# Patient Record
Sex: Female | Born: 1995 | Hispanic: Yes | Marital: Single | Smoking: Never smoker
Health system: Southern US, Community
[De-identification: ages and names within clinical notes are randomized; demographics above are authoritative.]

## PROBLEM LIST (undated history)

## (undated) ENCOUNTER — Ambulatory Visit: Payer: Medicaid Other

## (undated) DIAGNOSIS — J45909 Unspecified asthma, uncomplicated: Secondary | ICD-10-CM

## (undated) DIAGNOSIS — F419 Anxiety disorder, unspecified: Secondary | ICD-10-CM

## (undated) DIAGNOSIS — F319 Bipolar disorder, unspecified: Secondary | ICD-10-CM

## (undated) HISTORY — DX: Bipolar disorder, unspecified: F31.9

## (undated) HISTORY — DX: Unspecified asthma, uncomplicated: J45.909

## (undated) HISTORY — PX: ABDOMINAL SURGERY: SHX537

## (undated) HISTORY — DX: Anxiety disorder, unspecified: F41.9

---
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
## 2015-01-20 ENCOUNTER — Encounter: Payer: Self-pay | Admitting: Emergency Medicine

## 2015-01-20 ENCOUNTER — Emergency Department
Admission: EM | Admit: 2015-01-20 | Discharge: 2015-01-20 | Disposition: A | Discharge status: Home / Self Care | Payer: Medicaid Other | Attending: Emergency Medicine | Admitting: Emergency Medicine

## 2015-01-20 DIAGNOSIS — R51 Headache: Secondary | ICD-10-CM | POA: Diagnosis present

## 2015-01-20 DIAGNOSIS — M549 Dorsalgia, unspecified: Secondary | ICD-10-CM | POA: Insufficient documentation

## 2015-01-20 DIAGNOSIS — G8929 Other chronic pain: Secondary | ICD-10-CM | POA: Insufficient documentation

## 2015-01-20 DIAGNOSIS — G43909 Migraine, unspecified, not intractable, without status migrainosus: Secondary | ICD-10-CM | POA: Diagnosis not present

## 2015-01-20 DIAGNOSIS — G43009 Migraine without aura, not intractable, without status migrainosus: Secondary | ICD-10-CM

## 2015-01-20 MED ORDER — METOCLOPRAMIDE HCL 10 MG PO TABS: 10.0000 mg | ORAL_TABLET | Freq: Three times a day (TID) | ORAL | Status: AC

## 2015-01-20 MED ORDER — DIPHENHYDRAMINE HCL 50 MG PO CAPS
50.0000 mg | ORAL_CAPSULE | Freq: Once | ORAL | Status: AC
  Administered 2015-01-20: 50 mg via ORAL
  Filled 2015-01-20: qty 1

## 2015-01-20 MED ORDER — DIPHENHYDRAMINE HCL 25 MG PO CAPS: 50.0000 mg | ORAL_CAPSULE | Freq: Four times a day (QID) | ORAL | Status: AC | PRN

## 2015-01-20 MED ORDER — METOCLOPRAMIDE HCL 10 MG PO TABS
10.0000 mg | ORAL_TABLET | Freq: Once | ORAL | Status: AC
  Administered 2015-01-20: 10 mg via ORAL
  Filled 2015-01-20: qty 1

## 2015-01-20 MED ORDER — ONDANSETRON 8 MG PO TBDP
8.0000 mg | ORAL_TABLET | ORAL | Status: AC
  Administered 2015-01-20: 8 mg via ORAL
  Filled 2015-01-20: qty 1
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
## 2015-01-20 NOTE — ED Notes (Signed)
Pt presents with headache since yesterday. Ibuprofen not helping.

## 2015-01-20 NOTE — ED Notes (Signed)
Patient requesting to speak to the doctor because she has additional questions about medications.

## 2015-01-20 NOTE — Discharge Instructions (Signed)
Cefalea migrañosa °(Migraine Headache) °Una cefalea migrañosa es un dolor intenso y punzante en uno o ambos lados de la cabeza. La migraña puede durar desde 30 minutos hasta varias horas. °CAUSAS  °No siempre se conoce la causa exacta de la cefalea migrañosa. Sin embargo, pueden aparecer cuando los nervios del cerebro se irritan y liberan ciertas sustancias químicas que causan inflamación. Esto ocasiona dolor. °Existen también ciertos factores que pueden desencadenar las migrañas, como los siguientes: °· Alcohol. °· Fumar. °· Estrés. °· La menstruación °· Quesos añejados. °· Los alimentos o las bebidas que contienen nitratos, glutamato, aspartamo o tiramina. °· Falta de sueño. °· Chocolate. °· Cafeína. °· Hambre. °· Actividad física extenuante. °· Fatiga. °· Medicamentos que se usan para tratar el dolor en el pecho (nitroglicerina), píldoras anticonceptivas, estrógeno y algunos medicamentos para la hipertensión arterial. °SIGNOS Y SÍNTOMAS °· Dolor en uno o ambos lados de la cabeza. °· Dolor pulsante o punzante. °· Dolor intenso que impide realizar las actividades diarias. °· Dolor que se agrava por cualquier actividad física. °· Náuseas, vómitos o ambos. °· Mareos. °· Dolor con la exposición a las luces brillantes, a los ruidos fuertes o la actividad. °· Sensibilidad general a las luces brillantes, a los ruidos fuertes o a los olores. °Antes de sufrir una migraña, puede recibir señales de advertencia (aura). Un aura puede incluir: °· Ver las luces intermitentes. °· Ver puntos brillantes, halos o líneas en zigzag. °· Tener una visión en túnel o visión borrosa. °· Sensación de entumecimiento u hormigueo. °· Dificultad para hablar °· Debilidad muscular. °DIAGNÓSTICO  °La cefalea migrañosa se diagnostica en función de lo siguiente: °· Síntomas. °· Examen físico. °· Una TC (tomografía computada) o resonancia magnética de la cabeza. Estas pruebas de diagnóstico por imagen no pueden diagnosticar las migrañas, pero pueden  ayudar a descartar otras causas de las cefaleas. °TRATAMIENTO °Le prescribirán medicamentos para aliviar el dolor y las náuseas. También podrán administrarse medicamentos para ayudar a prevenir las migrañas recurrentes.  °INSTRUCCIONES PARA EL CUIDADO EN EL HOGAR °· Sólo tome medicamentos de venta libre o recetados para calmar el dolor o el malestar, según las indicaciones de su médico. No se recomienda usar los opiáceos a largo plazo. °· Cuando tenga la migraña, acuéstese en un cuarto oscuro y tranquilo °· Lleve un registro diario para averiguar lo que puede provocar las cefaleas migrañosas. Por ejemplo, escriba: °¨ Lo que usted come y bebe. °¨ Cuánto tiempo duerme. °¨ Algún cambio en su dieta o en los medicamentos. °· Limite el consumo de bebidas alcohólicas. °· Si fuma, deje de hacerlo. °· Duerma entre 7 y 9 horas, o según las recomendaciones del médico. °· Limite el estrés. °· Mantenga las luces tenues si le molestan las luces brillantes y la migraña empeora. °SOLICITE ATENCIÓN MÉDICA DE INMEDIATO SI:  °· La migraña se hace cada vez más intensa. °· Tiene fiebre. °· Presenta rigidez en el cuello. °· Tiene pérdida de visión. °· Presenta debilidad muscular o pérdida del control muscular. °· Comienza a perder el equilibrio o tiene problemas para caminar. °· Sufre mareos o se desmaya. °· Tiene síntomas graves que son diferentes a los primeros síntomas. °ASEGÚRESE DE QUE:  °· Comprende estas instrucciones. °· Controlará su afección. °· Recibirá ayuda de inmediato si no mejora o si empeora. °Document Released: 06/26/2005 Document Revised: 04/16/2013 °Exit[employer]® Patient Information ©2015 [employer]. This information is not intended to replace advice given to you by your health care provider. Make sure you discuss any questions you have with your   health care provider. ° ° °
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
## 2015-04-22 DIAGNOSIS — G479 Sleep disorder, unspecified: Secondary | ICD-10-CM | POA: Insufficient documentation
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
## 2017-01-26 LAB — HM PAP SMEAR: HM Pap smear: NEGATIVE

## 2018-02-19 LAB — HM HIV SCREENING LAB: HM HIV Screening: NEGATIVE

## 2018-07-18 DIAGNOSIS — N920 Excessive and frequent menstruation with regular cycle: Secondary | ICD-10-CM | POA: Insufficient documentation

## 2019-09-15 ENCOUNTER — Telehealth: Payer: Self-pay | Admitting: Family Medicine

## 2019-09-16 ENCOUNTER — Ambulatory Visit: Payer: Self-pay

## 2019-09-26 ENCOUNTER — Ambulatory Visit: Payer: Self-pay

## 2019-09-30 ENCOUNTER — Other Ambulatory Visit: Payer: Self-pay

## 2019-09-30 ENCOUNTER — Ambulatory Visit (LOCAL_COMMUNITY_HEALTH_CENTER): Payer: Self-pay | Admitting: Physician Assistant

## 2019-09-30 DIAGNOSIS — Z3046 Encounter for surveillance of implantable subdermal contraceptive: Secondary | ICD-10-CM

## 2019-09-30 DIAGNOSIS — Z308 Encounter for other contraceptive management: Secondary | ICD-10-CM

## 2019-09-30 DIAGNOSIS — Z3009 Encounter for other general counseling and advice on contraception: Secondary | ICD-10-CM

## 2019-09-30 MED ORDER — ESTROGENS CONJUGATED 1.25 MG PO TABS: 1.2500 mg | ORAL_TABLET | Freq: Every day | ORAL | 0 refills | Status: AC

## 2019-09-30 MED ORDER — NORGESTIMATE-ETH ESTRADIOL 0.25-35 MG-MCG PO TABS: 1.0000 | ORAL_TABLET | Freq: Every day | ORAL | 0 refills | Status: AC

## 2019-09-30 NOTE — Progress Notes (Signed)
Nexplanon inserted at [facility] 04/05/2017. Client reports bleeding started 05/2019 and bleeds 3 weeks out of 4. Continuous vaginal bleeding began 08/2019. Per client, states had a few ocps at home and took them in addition to Ibuprofen and Midol, but bleeding has not improved. Jossie Ng, RN  Per client, treated for trich last week at [facility]. Jossie Ng, RN
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
## 2019-10-05 NOTE — Progress Notes (Signed)
Chart reviewed by Pharmacist  Suzanne Walker PharmD, Contract Pharmacist at [facility]  

## 2019-10-30 NOTE — Telephone Encounter (Signed)
Advize Pt . on how to stop bleeding from IUD has appt on the 3/19

## 2019-12-22 ENCOUNTER — Ambulatory Visit: Payer: Self-pay

## 2019-12-26 ENCOUNTER — Ambulatory Visit: Payer: Self-pay | Admitting: Physician Assistant

## 2019-12-26 ENCOUNTER — Encounter: Payer: Self-pay | Admitting: Physician Assistant

## 2019-12-26 ENCOUNTER — Other Ambulatory Visit: Payer: Self-pay

## 2019-12-26 DIAGNOSIS — Z113 Encounter for screening for infections with a predominantly sexual mode of transmission: Secondary | ICD-10-CM

## 2019-12-26 DIAGNOSIS — Z308 Encounter for other contraceptive management: Secondary | ICD-10-CM

## 2019-12-26 DIAGNOSIS — Z3009 Encounter for other general counseling and advice on contraception: Secondary | ICD-10-CM

## 2019-12-26 LAB — WET PREP FOR TRICH, YEAST, CLUE
Trichomonas Exam: NEGATIVE
Yeast Exam: NEGATIVE

## 2019-12-26 MED ORDER — ESTROGENS CONJUGATED 1.25 MG PO TABS: 1.2500 mg | ORAL_TABLET | Freq: Every day | ORAL | 0 refills | Status: AC

## 2019-12-26 NOTE — Progress Notes (Signed)
[facility] Department STI clinic/screening visit  Subjective:  Elaine Cruz is a 24 y.o. female being seen today for an STI screening visit. The patient reports they do have symptoms.  Patient reports that they do not desire a pregnancy in the next year.   They reported they are not interested in discussing contraception today.  No LMP recorded. Patient has had an implant.   Patient has the following medical conditions:   Patient Active Problem List   Diagnosis Date Noted  . Menorrhagia 07/18/2018  . Obesity 09/22/2014  . Bipolar disorder ([place]) 01/20/2013    Chief Complaint  Patient presents with  . SEXUALLY TRANSMITTED DISEASE    screening    HPI  Patient reports that she has had frequent urination and low back pain for a few days.  Also, she has started having heavy vaginal bleeding for the last 2 days.  States that she wants to make sure that she does not have Trich again.  Requests "that medicine again, the one that stopped my bleeding before but was not birth control pills."   States that she is due for Nexplanon removal in September and that when she took the "pills that stopped the bleeding" she stopped bleeding and did not bleed for almost 3 months.  Last HIV test was 09/2019 and last pap was about 3 years ago.  See flowsheet for further details and programmatic requirements.    The following portions of the patient's history were reviewed and updated as appropriate: allergies, current medications, past medical history, past social history, past surgical history and problem list.  Objective:  There were no vitals filed for this visit.  Physical Exam Constitutional:      General: She is not in acute distress.    Appearance: Normal appearance.  HENT:     Head: Normocephalic and atraumatic.     Comments: No nits, lice, or hair loss. No cervical, supraclavicular or axillary adenopathy. Eyes:     Conjunctiva/sclera: Conjunctivae normal.  Pulmonary:      Effort: Pulmonary effort is normal.  Skin:    General: Skin is warm and dry.  Neurological:     Mental Status: She is alert and oriented to person, place, and time.  Psychiatric:        Mood and Affect: Mood normal.        Behavior: Behavior normal.        Thought Content: Thought content normal.        Judgment: Judgment normal.     patient opts to self-collect samples for GC/Chlamydia and wet mount today.  Assessment and Plan:  Cherrill Scrima is a 24 y.o. female presenting to the [facility] Department for STI screening  1. Screening for STD (sexually transmitted disease) Patient into clinic with symptoms. Rec condoms with all sex. Await test results.  Counseled that RN will call if needs to RTC for treatment once results are back.  - WET PREP FOR [place], YEAST, CLUE - Chlamydia/Gonorrhea [place] [facility] - HIV [place] [facility] - Syphilis Serology, [facility]  2. Encounter for counseling regarding contraception Counseled that it can be normal to have irregular bleeding with the Nexplanon at any time. Rec that patient call for removal or removal/reinsertion in 2-3 months if bleeding resolves. Counseled that patient is also due for RP and pap then and can make one appointment to do everything in one visit. Rec that patient follow up with PCP for evaluation of urinary symptoms and back  pain.   3. Encounter for other contraceptive management Will give Rx for Premarin 1.25 mg #21 1 po QD for 21 days to d/c bleeding. Rec to complete as directed and let us know if bleeding persists. - estrogens, conjugated, (PREMARIN) 1.25 MG tablet; Take 1 tablet (1.25 mg total) by mouth daily.  Dispense: 21 tablet; Refill: 0     No follow-ups on file.  No future appointments.  Matt Holmes, PA

## 2020-04-05 ENCOUNTER — Other Ambulatory Visit: Payer: Self-pay

## 2020-04-05 ENCOUNTER — Ambulatory Visit: Payer: Medicaid Other

## 2020-04-05 ENCOUNTER — Encounter: Payer: Self-pay | Admitting: Physician Assistant

## 2020-04-05 ENCOUNTER — Ambulatory Visit (LOCAL_COMMUNITY_HEALTH_CENTER): Payer: Medicaid Other | Admitting: Physician Assistant

## 2020-04-05 VITALS — BP 121/80 | Ht 64.0 in | Wt 203.0 lb

## 2020-04-05 DIAGNOSIS — Z3046 Encounter for surveillance of implantable subdermal contraceptive: Secondary | ICD-10-CM

## 2020-04-05 DIAGNOSIS — Z01419 Encounter for gynecological examination (general) (routine) without abnormal findings: Secondary | ICD-10-CM | POA: Diagnosis not present

## 2020-04-05 DIAGNOSIS — Z3009 Encounter for other general counseling and advice on contraception: Secondary | ICD-10-CM

## 2020-04-05 DIAGNOSIS — Z113 Encounter for screening for infections with a predominantly sexual mode of transmission: Secondary | ICD-10-CM

## 2020-04-05 LAB — WET PREP FOR TRICH, YEAST, CLUE
Trichomonas Exam: NEGATIVE
Yeast Exam: NEGATIVE

## 2020-04-05 NOTE — Progress Notes (Signed)
Wet mount reviewed with provider and no treatment needed for wet mount per standing order and per provider verbal order. Pt scheduled to RTC for Nexplanon removal and reinsertion for 04/14/2020 at 8:40am per pt request and provider verbal order. Appt card given to pt and pt aware of appt date and time and to arrive early for check-in. Counseled pt that from now until coming in to either not have sex or use condoms every time and pt states understanding. Provider orders completed.

## 2020-04-05 NOTE — Progress Notes (Signed)
Family Planning Visit- Repeat Yearly Visit  Subjective:  Elaine Cruz is a 24 y.o. G1P1  being seen today for an well woman visit and to discuss family planning options.    She is currently using Nexplanon for pregnancy prevention. Patient reports she does not  want a pregnancy in the next year. Patient  has Bipolar disorder (HCC); Obesity; and Menorrhagia on their problem list.  Chief Complaint  Patient presents with  . Contraception    Physical, Nexplanon counseling    Patient reports that she would like to continue with a Nexplanon for [facility].  Per chart review, patient due for CBE and pap today.  Reports that she has increase urination but thinks that this is due to high amount of caffiene intake.  Ease of bruising has been for years and is usually when she is stressed and sleep walks, since she states that she bumps into things when she sleep walks and notices bruises the next morninig. Reports that she has allergies and thinks sore throat may be due to drainage from allergy symptoms.  Requests screening due to thicker vaginal discharge but no other symptoms.    Patient denies any other concerns today.    See flowsheet for other program required questions.   Body mass index is 34.84 kg/m. - Patient is eligible for diabetes screening based on BMI and age >61?  not applicable HA1C ordered? not applicable  Patient reports 1 partner in last year. Desires STI screening?  Yes   Has patient been screened once for HCV in the past?  No  No results found for: HCVAB  Does the patient have current of drug use, have a partner with drug use, and/or has been incarcerated since last result? No  If yes-- Screen for HCV through [facility]   Does the patient meet criteria for HBV testing? No  Criteria:  -Household, sexual or needle sharing contact with HBV -History of drug use -HIV positive -Those with known Hep C   Health Maintenance Due  Topic Date Due  . Hepatitis C Screening  Never  done  . COVID-19 Vaccine (1) Never done  . TETANUS/TDAP  05/19/2017  . PAP-Cervical Cytology Screening  01/27/2020  . PAP SMEAR-Modifier  01/27/2020  . INFLUENZA VACCINE  02/08/2020    Review of Systems  All other systems reviewed and are negative.   The following portions of the patient's history were reviewed and updated as appropriate: allergies, current medications, past family history, past medical history, past social history, past surgical history and problem list. Problem list updated.  Objective:   Vitals:   04/05/20 0912  BP: 121/80  Weight: 203 lb (92.1 kg)  Height: 5\' 4"  (1.626 m)    Physical Exam Vitals and nursing note reviewed.  Constitutional:      General: She is not in acute distress.    Appearance: Normal appearance.  HENT:     Head: Normocephalic and atraumatic.     Mouth/Throat:     Mouth: Mucous membranes are moist.     Pharynx: Oropharynx is clear. No oropharyngeal exudate or posterior oropharyngeal erythema.  Eyes:     Conjunctiva/sclera: Conjunctivae normal.  Cardiovascular:     Rate and Rhythm: Normal rate and regular rhythm.  Pulmonary:     Effort: Pulmonary effort is normal.     Breath sounds: Normal breath sounds.  Abdominal:     Palpations: Abdomen is soft. There is no mass.     Tenderness: There is no abdominal tenderness.  There is no guarding or rebound.  Genitourinary:    General: Normal vulva.     Rectum: Normal.     Comments: External genitalia/pubic area without nits, lice, edema, erythema, lesions and inguinal adenopathy. Vagina with normal mucosa and discharge. Cervix without visible lesions. Uterus firm, mobile, nt, no masses, no CMT, no adnexal tenderness or fullness. Musculoskeletal:     Cervical back: Neck supple. No tenderness.  Lymphadenopathy:     Cervical: No cervical adenopathy.  Skin:    General: Skin is warm and dry.     Findings: No bruising, erythema, lesion or rash.  Neurological:     Mental Status: She is  alert and oriented to person, place, and time.  Psychiatric:        Mood and Affect: Mood normal.        Behavior: Behavior normal.        Thought Content: Thought content normal.        Judgment: Judgment normal.       Assessment and Plan:  Elaine Cruz is a 24 y.o. female G1P1 presenting to the [facility] Department for an yearly well woman exam/family planning visit  Contraception counseling: Reviewed all forms of birth control options in the tiered based approach. available including abstinence; over the counter/barrier methods; hormonal contraceptive medication including pill, patch, ring, injection,contraceptive implant, ECP; hormonal and nonhormonal IUDs; permanent sterilization options including vasectomy and the various tubal sterilization modalities. Risks, benefits, and typical effectiveness rates were reviewed.  Questions were answered.  Written information was also given to the patient to review.  Patient desires to RTC for Nexplanon removal/reinsertion, this was prescribed for patient. She will follow up in  1 week and prn for surveillance.  She was told to call with any further questions, or with any concerns about this method of contraception.  Emphasized use of condoms 100% of the time for STI prevention.  Patient was not a candidate for ECP today.    1. Encounter for counseling regarding contraception Counseled patient re:  Nexplanon and that it can be effective for up to 4 years for most people. Counseled that patient may have increased irregular bleeding after the 3 year mark, but the device would still prevent pregnancy. Rec condoms with all sex for STD protection.  2. Well woman exam with routine gynecological exam Reviewed with patient healthy habits to maintain general health. Enc MVI 1 po daily. Enc to establish with/ follow up with PCP for primary care concerns, age appropriate screenings and illness. Await pap results.  Counseled that RN will call or  send letter once results are back.  - IGP, rfx Aptima HPV ASCU  3. Screening for STD (sexually transmitted disease) Await test results.  Counseled that RN will call if needs to RTC for treatment once results are back.  - WET PREP FOR TRICH, YEAST, CLUE - Chlamydia/Gonorrhea [place] [facility] - HIV/HCV [place] [facility] - Syphilis Serology, [place] [facility] - Gonococcus culture  4. Encounter for surveillance of implantable subdermal contraceptive Nexplanon easily palpated in left upper arm. RTC for Nexplanon removal/reinsertion.     Return in about 9 days (around 04/14/2020) for Nexplanon removal and reinsertion; annual and PRN.  Future Appointments  Date Time Provider Department Center  04/14/2020  8:40 AM [facility] PROVIDER [facility] None    Matt Holmes, PA

## 2020-04-05 NOTE — Progress Notes (Signed)
Pt is here for physical, Nexplanon removal and reinsertion. Pt reports is wanting to continue with Nexplanon as her birth control method. Pt reports last sex was 03/30/2020 without condom. Counseled pt that sperm can live in the body for up to 7 days and that there could be a chance of potential pregnancy if Nexplanon removal and reinsertion is done today, pt states understanding and that she does not want the risk of becoming pregnant but would like to discuss this with the provider.

## 2020-04-09 LAB — IGP, RFX APTIMA HPV ASCU: PAP Smear Comment: 0

## 2020-04-10 LAB — GONOCOCCUS CULTURE

## 2020-04-12 ENCOUNTER — Encounter: Payer: Self-pay | Admitting: Family Medicine

## 2020-04-12 LAB — HM HIV SCREENING LAB: HM HIV Screening: NEGATIVE

## 2020-04-12 LAB — HM HEPATITIS C SCREENING LAB: HM Hepatitis Screen: NEGATIVE

## 2020-04-14 ENCOUNTER — Ambulatory Visit: Payer: Self-pay

## 2020-04-21 ENCOUNTER — Ambulatory Visit: Payer: Self-pay

## 2020-06-04 ENCOUNTER — Other Ambulatory Visit: Payer: Self-pay

## 2020-06-04 ENCOUNTER — Emergency Department
Admission: EM | Admit: 2020-06-04 | Discharge: 2020-06-04 | Disposition: A | Discharge status: Home / Self Care | Payer: Self-pay | Attending: Emergency Medicine | Admitting: Emergency Medicine

## 2020-06-04 ENCOUNTER — Encounter: Payer: Self-pay | Admitting: Emergency Medicine

## 2020-06-04 DIAGNOSIS — Z5321 Procedure and treatment not carried out due to patient leaving prior to being seen by health care provider: Secondary | ICD-10-CM | POA: Insufficient documentation

## 2020-06-04 DIAGNOSIS — R519 Headache, unspecified: Secondary | ICD-10-CM | POA: Insufficient documentation

## 2020-06-04 DIAGNOSIS — J029 Acute pharyngitis, unspecified: Secondary | ICD-10-CM | POA: Insufficient documentation

## 2020-06-04 DIAGNOSIS — R55 Syncope and collapse: Secondary | ICD-10-CM | POA: Insufficient documentation

## 2020-06-04 DIAGNOSIS — R072 Precordial pain: Secondary | ICD-10-CM | POA: Insufficient documentation

## 2020-06-04 DIAGNOSIS — R0602 Shortness of breath: Secondary | ICD-10-CM | POA: Insufficient documentation

## 2020-06-04 DIAGNOSIS — H538 Other visual disturbances: Secondary | ICD-10-CM | POA: Insufficient documentation
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
## 2020-12-30 ENCOUNTER — Ambulatory Visit: Payer: Medicaid Other

## 2021-03-18 ENCOUNTER — Ambulatory Visit: Payer: Medicaid Other

## 2021-03-18 ENCOUNTER — Telehealth: Payer: Self-pay

## 2021-03-18 NOTE — Telephone Encounter (Signed)
Patient did not keep appointment on 03/18/2021.Marland KitchenBurt Knack, RN

## 2021-03-18 NOTE — Telephone Encounter (Signed)
TC to patient to let her know that her last PE was 04/05/2020 and that if she still has Medicaid, they will not cover cost of a PE until 04/06/2021. She is scheduled for PE and nexplanon removal today, and may want to consider changing her appointment to a date later this month. LM with number to call.Burt Knack, RN

## 2021-04-11 NOTE — Telephone Encounter (Signed)
PAP letter mailed today.  PAP due 03-2021. Should patient call in for an appointment, please schedule a Physical and PAP with her next appointment. Hart Carwin, RN

## 2021-06-01 ENCOUNTER — Telehealth: Payer: Self-pay

## 2021-06-01 NOTE — Telephone Encounter (Signed)
Telephone call to patient regarding the need for a repeat PAP due 03-2021.  Left a message to return my call at [phone].  No response to the PAP letter mailed on 04/11/2021.  Hart Carwin, RN

## 2021-10-10 ENCOUNTER — Other Ambulatory Visit: Payer: Self-pay

## 2022-03-15 ENCOUNTER — Encounter: Payer: Self-pay | Admitting: Nurse Practitioner

## 2022-03-15 ENCOUNTER — Ambulatory Visit (LOCAL_COMMUNITY_HEALTH_CENTER): Payer: Medicaid Other | Admitting: Nurse Practitioner

## 2022-03-15 ENCOUNTER — Ambulatory Visit: Payer: Medicaid Other

## 2022-03-15 VITALS — BP 106/72 | Ht 64.0 in | Wt 178.2 lb

## 2022-03-15 DIAGNOSIS — Z3046 Encounter for surveillance of implantable subdermal contraceptive: Secondary | ICD-10-CM

## 2022-03-15 DIAGNOSIS — Z30019 Encounter for initial prescription of contraceptives, unspecified: Secondary | ICD-10-CM | POA: Diagnosis not present

## 2022-03-15 DIAGNOSIS — Z3009 Encounter for other general counseling and advice on contraception: Secondary | ICD-10-CM

## 2022-03-15 DIAGNOSIS — Z30017 Encounter for initial prescription of implantable subdermal contraceptive: Secondary | ICD-10-CM | POA: Diagnosis not present

## 2022-03-15 DIAGNOSIS — Z113 Encounter for screening for infections with a predominantly sexual mode of transmission: Secondary | ICD-10-CM

## 2022-03-15 DIAGNOSIS — Z01419 Encounter for gynecological examination (general) (routine) without abnormal findings: Secondary | ICD-10-CM

## 2022-03-15 LAB — WET PREP FOR TRICH, YEAST, CLUE
Trichomonas Exam: NEGATIVE
Yeast Exam: NEGATIVE

## 2022-03-15 LAB — HM HEPATITIS C SCREENING LAB: HM Hepatitis Screen: NEGATIVE

## 2022-03-15 LAB — HEPATITIS B SURFACE ANTIGEN: Hepatitis B Surface Ag: NONREACTIVE

## 2022-03-15 LAB — HM HIV SCREENING LAB: HM HIV Screening: NEGATIVE

## 2022-03-15 MED ORDER — ETONOGESTREL 68 MG ~~LOC~~ IMPL
68.0000 mg | DRUG_IMPLANT | Freq: Once | SUBCUTANEOUS | Status: AC
  Administered 2022-03-15: 68 mg via SUBCUTANEOUS

## 2022-03-15 NOTE — Progress Notes (Signed)
Nexplanon Removal and Insertion  Patient identified, informed consent performed, consent signed.   Patient does understand that irregular bleeding is a very common side effect of this medication. She was advised to have backup contraception for one week after replacement of the implant. Patient deemed to meet WHO criteria for being reasonably certain she is not pregnant.  Appropriate time out taken. Nexplanon site identified. Area prepped in usual sterile fashon. 3 ml of 1% lidocaine with epinephrine was used to anesthetize the area at the distal end of the implant. A small stab incision was made right beside the implant on the distal portion. The Nexplanon rod was grasped using hemostats and removed without difficulty. There was minimal blood loss. There were no complications.   Confirmed correct location of insertion site. The insertion site was identified 8-10 cm (3-4 inches) from the medial epicondyle of the humerus and 3-5 cm (1.25-2 inches) posterior to (below) the sulcus (groove) between the biceps and triceps muscles of the patient's left arm. New Nexplanon removed from packaging, Device confirmed in needle, then inserted full length of needle and withdrawn per handbook instructions. Nexplanon was able to palpated in the patient's left arm; patient palpated the insert herself.  There was minimal blood loss. Patient insertion site covered with guaze and a pressure bandage to reduce any bruising. The patient tolerated the procedure well and was given post procedure instructions.   Lashanna Angelo, FNP  
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
## 2022-03-15 NOTE — Progress Notes (Signed)
Pt here for PE, STD check, Nexplanon removal and reinsertion.  Wet prep results reviewed, no treatment required.  Pt given FP packet.  Berdie Ogren, RN

## 2022-03-16 LAB — IGP, RFX APTIMA HPV ASCU: PAP Smear Comment: 0
